# Patient Record
Sex: Male | Born: 1995 | Race: White | Hispanic: No | Marital: Single | Smoking: Current some day smoker
Health system: Southern US, Community
[De-identification: ages and names within clinical notes are randomized; demographics above are authoritative.]

## PROBLEM LIST (undated history)

## (undated) HISTORY — PX: NO PAST SURGERIES: SHX2092

---
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
## 2016-12-28 ENCOUNTER — Ambulatory Visit
Admission: EM | Admit: 2016-12-28 | Discharge: 2016-12-28 | Disposition: A | Discharge status: Home / Self Care | Payer: Self-pay | Attending: Emergency Medicine | Admitting: Emergency Medicine

## 2016-12-28 ENCOUNTER — Ambulatory Visit (INDEPENDENT_AMBULATORY_CARE_PROVIDER_SITE_OTHER): Payer: Self-pay

## 2016-12-28 DIAGNOSIS — M62830 Muscle spasm of back: Secondary | ICD-10-CM

## 2016-12-28 MED ORDER — METAXALONE 800 MG PO TABS: 800.0000 mg | ORAL_TABLET | Freq: Three times a day (TID) | ORAL | 0 refills | Status: DC

## 2016-12-28 MED ORDER — KETOROLAC TROMETHAMINE 60 MG/2ML IM SOLN
60.0000 mg | Freq: Once | INTRAMUSCULAR | Status: AC
  Administered 2016-12-28: 60 mg via INTRAMUSCULAR

## 2016-12-28 MED ORDER — NAPROXEN 500 MG PO TABS: 500.0000 mg | ORAL_TABLET | Freq: Two times a day (BID) | ORAL | 0 refills | Status: DC

## 2016-12-28 NOTE — ED Provider Notes (Signed)
[facility]    CSN: [id number] Arrival date & time: 12/28/16  1016     History   Chief Complaint Chief Complaint  Patient presents with  . Back Pain    HPI Rodney Robinson is a 21 y.o. male.   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 He does not have any history of injury. Not changed his routine. He states he remembers a couple of days prior to his back pain initiation that he lifted a water heater. The present time if he looks down with his head the pain is intensified and also certain other motions. The pain has not  improved at all. He has had no fever chills rash. He has no photophobia. He is able to move his neck through a range of motion with extremes of flexion is what caused him to have back pain. He denies any incontinence. Patient denies headaches.         History reviewed. No pertinent past medical history.  There are no active problems to display for this patient.   Past Surgical History:  Procedure Laterality Date  . NO PAST SURGERIES         Home Medications    Prior to Admission medications   Medication Sig Start Date End Date Taking? Authorizing Provider  metaxalone (SKELAXIN) 800 MG tablet Take 1 tablet (800 mg total) by mouth 3 (three) times daily. 12/28/16   Lutricia Feil, PA-C  naproxen (NAPROSYN) 500 MG tablet Take 1 tablet (500 mg total) by mouth 2 (two) times daily with a meal. 12/28/16   Lutricia Feil, PA-C    Family History History reviewed. No pertinent family history.  Social History Social History  Substance Use Topics  . Smoking status: Current Every Day Smoker    Packs/day: 0.50  . Smokeless tobacco: Never Used  . Alcohol use No     Allergies   Patient has no known allergies.   Review of Systems Review of Systems  Constitutional: Positive for activity change. Negative for appetite change, chills, fatigue and fever.  Musculoskeletal: Positive for back  pain.  All other systems reviewed and are negative.    Physical Exam Triage Vital Signs ED Triage Vitals  Enc Vitals Group     BP 12/28/16 1048 119/64     Pulse Rate 12/28/16 1048 79     Resp 12/28/16 1048 17     Temp 12/28/16 1048 98.5 F (36.9 C)     Temp Source 12/28/16 1048 Oral     SpO2 12/28/16 1048 99 %     Weight 12/28/16 1046 250 lb (113.4 kg)     Height 12/28/16 1046 5\' 8"  (1.727 m)     Head Circumference --      Peak Flow --      Pain Score 12/28/16 1047 9     Pain Loc --      Pain Edu? --      Excl. in GC? --    No data found.   Updated Vital Signs BP 119/64 (BP Location: Left Arm)   Pulse 79   Temp 98.5 F (36.9 C) (Oral)   Resp 17   Ht 5\' 8"  (1.727 m)   Wt 250 lb (113.4 kg)   SpO2 99%   BMI 38.01 kg/m   Visual Acuity Right Eye Distance:   Left Eye Distance:   Bilateral Distance:    Right Eye Near:   Left  Eye Near:    Bilateral Near:     Physical Exam  Constitutional: He is oriented to person, place, and time. He appears well-developed and well-nourished. No distress.  HENT:  Head: Normocephalic and atraumatic.  Eyes: Pupils are equal, round, and reactive to light.  Neck: Neck supple.  Induration has decreased range of motion to forward flexion I with complaints of pain in his back.  Pulmonary/Chest: Effort normal and breath sounds normal.  Musculoskeletal: Normal range of motion.  Examination the lumbar spine shows a level pelvis in stance. Patient is able to forward flex with his hands to levels knees with complaints of thoracic back pain. Lateral flexion is equal bilaterally with good higher complaints of the discomfort to rightward flexion. Thoracic rotation to the right also increases his discomfort. Patient is able to toe and heel walk adequately. EHL peroneal and anterior tibialis muscles are strong to testing. Patient to light touch is intact throughout. DTRs are 2+ over 4 and bilaterally symmetrical at the knee and ankle jerk. There is  no clonus present. Straight leg raise testing in the seated position is positive at 30-45 on the left with pain in the thoracic back. Is positive at 75 on the right.  Neurological: He is alert and oriented to person, place, and time.  Skin: Skin is warm and dry. He is not diaphoretic.  Psychiatric: He has a normal mood and affect. His behavior is normal. Judgment and thought content normal.  Nursing note and vitals reviewed.    UC Treatments / Results  Labs (all labs ordered are listed, but only abnormal results are displayed) Labs Reviewed - No data to display  EKG  EKG Interpretation None       Radiology Dg Thoracic Spine 2 View  Result Date: 12/28/2016 CLINICAL DATA:  Lower thoracic pain with movement. EXAM: THORACIC SPINE 2 VIEWS COMPARISON:  None. FINDINGS: There is no evidence of thoracic spine fracture. Alignment is normal. No other significant bone abnormalities are identified. IMPRESSION: Negative. Electronically Signed   By: Kennith CenterEric  Mansell M.D.   On: 12/28/2016 12:47    Procedures Procedures (including critical care time)  Medications Ordered in UC Medications  ketorolac (TORADOL) injection 60 mg (60 mg Intramuscular Given 12/28/16 1152)     Initial Impression / Assessment and Plan / UC Course  I have reviewed the triage vital signs and the nursing notes.  Pertinent labs & imaging results that were available during my care of the patient were reviewed by me and considered in my medical decision making (see chart for details). Plan: 1. Test/x-ray results and diagnosis reviewed with patient 2. rx as per orders; risks, benefits, potential side effects reviewed with patient 3. Recommend supportive treatment with Rest and symptom avoidance. Use ice as IV every 2 hours of 4-5 times daily. Use massage technique was also explained to the patient. Patient should not drive while taking Skelaxin or perform activities that require concentration and judgment. He is not  improving within a week he should follow-up care clinic or at primary care physician's office. If he has worsening symptoms he should go to the emergency room 4. F/u prn if symptoms worsen or don't improve       Final Clinical Impressions(s) / UC Diagnoses   Final diagnoses:  Muscle spasm of back    New Prescriptions New Prescriptions   METAXALONE (SKELAXIN) 800 MG TABLET    Take 1 tablet (800 mg total) by mouth 3 (three) times daily.   NAPROXEN (NAPROSYN) 500  MG TABLET    Take 1 tablet (500 mg total) by mouth 2 (two) times daily with a meal.     Controlled Substance Prescriptions [place] Controlled Substance Registry consulted? Not Applicable   Lutricia Feil, PA-C 12/28/16 1303

## 2016-12-28 NOTE — ED Triage Notes (Signed)
Patient complains of mid back pain x 2 days ago upon wakening. Patient states that he has had no injury. Patient states that back hurts to stand up straight.

## 2017-10-17 ENCOUNTER — Other Ambulatory Visit: Payer: Self-pay

## 2017-10-17 ENCOUNTER — Ambulatory Visit
Admission: EM | Admit: 2017-10-17 | Discharge: 2017-10-17 | Disposition: A | Discharge status: Home / Self Care | Payer: Self-pay | Attending: Internal Medicine | Admitting: Internal Medicine

## 2017-10-17 DIAGNOSIS — J069 Acute upper respiratory infection, unspecified: Secondary | ICD-10-CM

## 2017-10-17 LAB — RAPID STREP SCREEN (MED CTR MEBANE ONLY): STREPTOCOCCUS, GROUP A SCREEN (DIRECT): NEGATIVE

## 2017-10-17 MED ORDER — AZITHROMYCIN 250 MG PO TABS: 250.0000 mg | ORAL_TABLET | Freq: Every day | ORAL | 0 refills | Status: AC

## 2017-10-17 MED ORDER — BENZONATATE 200 MG PO CAPS: ORAL_CAPSULE | ORAL | 0 refills | Status: AC

## 2017-10-17 MED ORDER — GUAIFENESIN-CODEINE 100-10 MG/5ML PO SYRP: 5.0000 mL | ORAL_SOLUTION | Freq: Three times a day (TID) | ORAL | 0 refills | Status: AC | PRN

## 2017-10-17 NOTE — ED Triage Notes (Signed)
Patient complains of sore throat and cough x 2 weeks. Patient states that he has been noticing worsening symptoms. Patient states that he has been trying lozenges without relief.

## 2017-10-17 NOTE — ED Provider Notes (Signed)
[facility]    CSN: [id number] Arrival date & time: 10/17/17  1042     History   Chief Complaint Chief Complaint  Patient presents with  . Sore Throat  . Cough    HPI Rodney Robinson is a 22 y.o. male.   HPI  22 year old male presents with sore throat and cough that has had for 2 weeks.  States that the symptoms seems to be worsening.  Using cough drops without any relief.  He is coughing mostly at nighttime.  Proximal-isms of coughing which is most bothersome to him.  He continues to smoke.  Days early in the illness he had a fever but has since subsided.  Temperature today is 99.6 pulse rate of 105 O2 sats on room air is 97%        History reviewed. No pertinent past medical history.  There are no active problems to display for this patient.   Past Surgical History:  Procedure Laterality Date  . NO PAST SURGERIES         Home Medications    Prior to Admission medications   Medication Sig Start Date End Date Taking? Authorizing Provider  azithromycin (ZITHROMAX) 250 MG tablet Take 1 tablet (250 mg total) by mouth daily. Take first 2 tablets together, then 1 every day until finished. 10/17/17   Lutricia Feil, PA-C  benzonatate (TESSALON) 200 MG capsule Take one cap TID PRN cough 10/17/17   Ovid Curd P, PA-C  guaiFENesin-codeine (CHERATUSSIN AC) 100-10 MG/5ML syrup Take 5 mLs by mouth 3 (three) times daily as needed for cough. 10/17/17   Lutricia Feil, PA-C    Family History History reviewed. No pertinent family history.  Social History Social History   Tobacco Use  . Smoking status: Current Some Day Smoker    Types: Cigarettes  . Smokeless tobacco: Never Used  Substance Use Topics  . Alcohol use: No  . Drug use: Yes    Types: Marijuana     Allergies   Patient has no known allergies.   Review of Systems Review of Systems  Constitutional: Positive for activity change, chills, fatigue and fever. Negative for appetite  change.  HENT: Positive for congestion, postnasal drip, sinus pressure, sinus pain and sore throat.   Respiratory: Positive for cough. Negative for wheezing and stridor.   All other systems reviewed and are negative.    Physical Exam Triage Vital Signs ED Triage Vitals  Enc Vitals Group     BP 10/17/17 1051 (!) 143/86     Pulse Rate 10/17/17 1051 (!) 105     Resp 10/17/17 1051 18     Temp 10/17/17 1051 99.6 F (37.6 C)     Temp Source 10/17/17 1051 Oral     SpO2 10/17/17 1051 97 %     Weight 10/17/17 1050 268 lb (121.6 kg)     Height 10/17/17 1050  (1.727 m)     Head Circumference --      Peak Flow --      Pain Score 10/17/17 1050 8     Pain Loc --      Pain Edu? --      Excl. in GC? --    No data found.  Updated Vital Signs BP (!) 143/86 (BP Location: Left Arm)   Pulse (!) 105   Temp 99.6 F (37.6 C) (Oral)   Resp 18   Ht  (1.727 m)   Wt 268 lb (121.6 kg)  SpO2 97%   BMI 40.75 kg/m   Visual Acuity Right Eye Distance:   Left Eye Distance:   Bilateral Distance:    Right Eye Near:   Left Eye Near:    Bilateral Near:     Physical Exam  Constitutional: He is oriented to person, place, and time. He appears well-developed and well-nourished.  Non-toxic appearance. He does not appear ill. No distress.  HENT:  Head: Normocephalic.  Right Ear: Hearing, tympanic membrane and ear canal normal.  Left Ear: Hearing, tympanic membrane and ear canal normal.  Mouth/Throat: Oropharynx is clear and moist and mucous membranes are normal. No oral lesions. No uvula swelling. No oropharyngeal exudate, posterior oropharyngeal edema, posterior oropharyngeal erythema or tonsillar abscesses. Tonsils are 0 on the right. Tonsils are 0 on the left. No tonsillar exudate.  Eyes: Pupils are equal, round, and reactive to light.  Neck: Normal range of motion.  Pulmonary/Chest: Effort normal and breath sounds normal.  Lymphadenopathy:    He has no cervical adenopathy.    Neurological: He is alert and oriented to person, place, and time.  Skin: Skin is warm and dry.  Psychiatric: He has a normal mood and affect. His behavior is normal.  Nursing note and vitals reviewed.    UC Treatments / Results  Labs (all labs ordered are listed, but only abnormal results are displayed) Labs Reviewed  RAPID STREP SCREEN ([facility] ONLY)  CULTURE, GROUP A STREP [facility])    EKG None  Radiology No results found.  Procedures Procedures (including critical care time)  Medications Ordered in UC Medications - No data to display  Initial Impression / Assessment and Plan / UC Course  I have reviewed the triage vital signs and the nursing notes.  Pertinent labs & imaging results that were available during my care of the patient were reviewed by me and considered in my medical decision making (see chart for details).     Plan: 1. Test/x-ray results and diagnosis reviewed with patient 2. rx as per orders; risks, benefits, potential side effects reviewed with patient 3. Recommend supportive treatment with rest and fluids.  Treat with  cough suppressants.  Symptoms for over 2 weeks and continues to worsen I will place him on azithromycin for his cough symptoms.  Advised him this could be a virus and may not respond to the antibiotics but at this point is worth a try.  Given him cautions regarding the use of the pain cough syrup.  Should not drive or  perform activities requiring concentration or judgment.  He is not improving he should follow-up in our clinic or find a primary care physician 4. F/u prn if symptoms worsen or don't improve  Final Clinical Impressions(s) / UC Diagnoses   Final diagnoses:  Upper respiratory tract infection, unspecified type   Discharge Instructions   None    ED Prescriptions    Medication Sig Dispense Auth. Provider   azithromycin (ZITHROMAX) 250 MG tablet Take 1 tablet (250 mg total) by mouth daily. Take first 2 tablets together,  then 1 every day until finished. 6 tablet Ovid Curd P, PA-C   benzonatate (TESSALON) 200 MG capsule Take one cap TID PRN cough 30 capsule Ovid Curd P, PA-C   guaiFENesin-codeine (CHERATUSSIN AC) 100-10 MG/5ML syrup Take 5 mLs by mouth 3 (three) times daily as needed for cough. 120 mL Lutricia Feil, PA-C     Controlled Substance Prescriptions [place] Controlled Substance Registry consulted? Not Applicable   Lutricia Feil,  PA-C 10/17/17 1209

## 2017-10-20 LAB — CULTURE, GROUP A STREP (THRC)

## 2018-06-24 ENCOUNTER — Encounter: Payer: Self-pay | Admitting: Emergency Medicine

## 2018-06-24 ENCOUNTER — Ambulatory Visit: Admission: EM | Admit: 2018-06-24 | Discharge: 2018-06-24 | Discharge status: Against Medical Advice | Payer: Self-pay

## 2018-06-24 ENCOUNTER — Other Ambulatory Visit: Payer: Self-pay

## 2018-06-24 NOTE — ED Triage Notes (Signed)
Patient c/o stuffy nose, sinus congestion and pressure and scratchy throat for about a week.  Patient denies fevers.
